# Patient Record
Sex: Male | Born: 1935 | Race: White | Hispanic: No | Marital: Married | Smoking: Former smoker
Health system: Southern US, Community
[De-identification: ages and names within clinical notes are randomized; demographics above are authoritative.]

## PROBLEM LIST (undated history)

## (undated) DIAGNOSIS — N400 Enlarged prostate without lower urinary tract symptoms: Secondary | ICD-10-CM

## (undated) DIAGNOSIS — E7800 Pure hypercholesterolemia, unspecified: Secondary | ICD-10-CM

## (undated) DIAGNOSIS — E119 Type 2 diabetes mellitus without complications: Secondary | ICD-10-CM

## (undated) DIAGNOSIS — I10 Essential (primary) hypertension: Secondary | ICD-10-CM

## (undated) DIAGNOSIS — G301 Alzheimer's disease with late onset: Secondary | ICD-10-CM

## (undated) DIAGNOSIS — F0280 Dementia in other diseases classified elsewhere without behavioral disturbance: Secondary | ICD-10-CM

## (undated) DIAGNOSIS — H3530 Unspecified macular degeneration: Secondary | ICD-10-CM

## (undated) HISTORY — DX: Pure hypercholesterolemia, unspecified: E78.00

## (undated) HISTORY — DX: Essential (primary) hypertension: I10

## (undated) HISTORY — PX: OTHER SURGICAL HISTORY: SHX169

## (undated) HISTORY — DX: Type 2 diabetes mellitus without complications: E11.9

## (undated) HISTORY — DX: Dementia in other diseases classified elsewhere, unspecified severity, without behavioral disturbance, psychotic disturbance, mood disturbance, and anxiety: F02.80

## (undated) HISTORY — PX: CATARACT EXTRACTION: SUR2

## (undated) HISTORY — DX: Alzheimer's disease with late onset: G30.1

## (undated) HISTORY — PX: MOUTH SURGERY: SHX715

## (undated) HISTORY — DX: Unspecified macular degeneration: H35.30

## (undated) HISTORY — DX: Benign prostatic hyperplasia without lower urinary tract symptoms: N40.0

---

## 1998-02-21 ENCOUNTER — Ambulatory Visit (HOSPITAL_COMMUNITY): Admission: RE | Admit: 1998-02-21 | Discharge: 1998-02-21 | Payer: Self-pay | Admitting: *Deleted

## 1999-04-18 ENCOUNTER — Encounter (INDEPENDENT_AMBULATORY_CARE_PROVIDER_SITE_OTHER): Payer: Self-pay | Admitting: *Deleted

## 1999-04-18 ENCOUNTER — Encounter (INDEPENDENT_AMBULATORY_CARE_PROVIDER_SITE_OTHER): Payer: Self-pay | Admitting: Specialist

## 1999-04-18 ENCOUNTER — Ambulatory Visit (HOSPITAL_COMMUNITY): Admission: RE | Admit: 1999-04-18 | Discharge: 1999-04-18 | Payer: Self-pay | Admitting: *Deleted

## 2001-09-21 HISTORY — PX: US ECHOCARDIOGRAPHY: HXRAD669

## 2002-01-03 ENCOUNTER — Ambulatory Visit (HOSPITAL_COMMUNITY): Admission: RE | Admit: 2002-01-03 | Discharge: 2002-01-03 | Payer: Self-pay | Admitting: Internal Medicine

## 2002-01-03 ENCOUNTER — Encounter: Payer: Self-pay | Admitting: Internal Medicine

## 2002-10-19 ENCOUNTER — Encounter (INDEPENDENT_AMBULATORY_CARE_PROVIDER_SITE_OTHER): Payer: Self-pay | Admitting: *Deleted

## 2002-10-19 ENCOUNTER — Encounter (INDEPENDENT_AMBULATORY_CARE_PROVIDER_SITE_OTHER): Payer: Self-pay | Admitting: Specialist

## 2002-10-19 ENCOUNTER — Ambulatory Visit (HOSPITAL_COMMUNITY): Admission: RE | Admit: 2002-10-19 | Discharge: 2002-10-19 | Payer: Self-pay | Admitting: *Deleted

## 2002-12-05 ENCOUNTER — Ambulatory Visit (HOSPITAL_COMMUNITY): Admission: RE | Admit: 2002-12-05 | Discharge: 2002-12-05 | Payer: Self-pay | Admitting: Internal Medicine

## 2002-12-05 ENCOUNTER — Encounter: Payer: Self-pay | Admitting: Internal Medicine

## 2004-02-26 ENCOUNTER — Encounter (INDEPENDENT_AMBULATORY_CARE_PROVIDER_SITE_OTHER): Payer: Self-pay | Admitting: *Deleted

## 2004-02-26 ENCOUNTER — Ambulatory Visit (HOSPITAL_COMMUNITY): Admission: RE | Admit: 2004-02-26 | Discharge: 2004-02-26 | Payer: Self-pay | Admitting: *Deleted

## 2004-02-26 ENCOUNTER — Encounter: Payer: Self-pay | Admitting: Gastroenterology

## 2004-03-13 ENCOUNTER — Ambulatory Visit (HOSPITAL_COMMUNITY): Admission: RE | Admit: 2004-03-13 | Discharge: 2004-03-13 | Payer: Self-pay | Admitting: Internal Medicine

## 2005-04-09 ENCOUNTER — Encounter (INDEPENDENT_AMBULATORY_CARE_PROVIDER_SITE_OTHER): Payer: Self-pay | Admitting: *Deleted

## 2005-04-09 ENCOUNTER — Ambulatory Visit (HOSPITAL_COMMUNITY): Admission: RE | Admit: 2005-04-09 | Discharge: 2005-04-09 | Payer: Self-pay | Admitting: *Deleted

## 2005-04-09 ENCOUNTER — Encounter (INDEPENDENT_AMBULATORY_CARE_PROVIDER_SITE_OTHER): Payer: Self-pay | Admitting: Specialist

## 2009-03-14 ENCOUNTER — Ambulatory Visit: Payer: Self-pay | Admitting: Gastroenterology

## 2009-03-14 DIAGNOSIS — Z860100 Personal history of colon polyps, unspecified: Secondary | ICD-10-CM | POA: Insufficient documentation

## 2009-03-14 DIAGNOSIS — K219 Gastro-esophageal reflux disease without esophagitis: Secondary | ICD-10-CM

## 2009-03-14 DIAGNOSIS — K5900 Constipation, unspecified: Secondary | ICD-10-CM | POA: Insufficient documentation

## 2009-03-14 DIAGNOSIS — K2270 Barrett's esophagus without dysplasia: Secondary | ICD-10-CM | POA: Insufficient documentation

## 2009-03-19 ENCOUNTER — Telehealth: Payer: Self-pay | Admitting: Gastroenterology

## 2009-03-22 ENCOUNTER — Ambulatory Visit: Payer: Self-pay | Admitting: Gastroenterology

## 2009-03-22 ENCOUNTER — Encounter: Payer: Self-pay | Admitting: Gastroenterology

## 2009-03-22 ENCOUNTER — Telehealth: Payer: Self-pay | Admitting: Gastroenterology

## 2009-03-26 ENCOUNTER — Encounter: Payer: Self-pay | Admitting: Gastroenterology

## 2009-03-26 ENCOUNTER — Telehealth: Payer: Self-pay | Admitting: Gastroenterology

## 2009-03-28 ENCOUNTER — Telehealth: Payer: Self-pay | Admitting: Gastroenterology

## 2009-04-09 ENCOUNTER — Telehealth: Payer: Self-pay | Admitting: Gastroenterology

## 2009-04-10 ENCOUNTER — Telehealth: Payer: Self-pay | Admitting: Gastroenterology

## 2009-04-15 ENCOUNTER — Telehealth: Payer: Self-pay | Admitting: Gastroenterology

## 2009-04-30 ENCOUNTER — Ambulatory Visit (HOSPITAL_COMMUNITY): Admission: RE | Admit: 2009-04-30 | Discharge: 2009-04-30 | Payer: Self-pay | Admitting: Internal Medicine

## 2009-04-30 ENCOUNTER — Ambulatory Visit: Payer: Self-pay | Admitting: Vascular Surgery

## 2009-06-04 ENCOUNTER — Emergency Department (HOSPITAL_COMMUNITY): Admission: EM | Admit: 2009-06-04 | Discharge: 2009-06-04 | Payer: Self-pay | Admitting: Emergency Medicine

## 2009-07-29 ENCOUNTER — Ambulatory Visit (HOSPITAL_COMMUNITY): Admission: RE | Admit: 2009-07-29 | Discharge: 2009-07-29 | Payer: Self-pay | Admitting: Internal Medicine

## 2009-09-17 HISTORY — PX: NM MYOCAR PERF WALL MOTION: HXRAD629

## 2010-10-15 LAB — URINALYSIS, ROUTINE W REFLEX MICROSCOPIC
Nitrite: NEGATIVE
Specific Gravity, Urine: 1.015 (ref 1.005–1.030)
pH: 7 (ref 5.0–8.0)

## 2010-10-15 LAB — GLUCOSE, CAPILLARY

## 2010-11-28 NOTE — Op Note (Signed)
   NAME:  Andres Bailey, Andres Bailey                          ACCOUNT NO.:  [id number]   MEDICAL RECORD NO.:  [id number]                   PATIENT TYPE:  AMB   LOCATION:  ENDO                                 FACILITY:  [facility]   PHYSICIAN:  Georgiana Spinner, M.D.                 DATE OF BIRTH:  May 13, 1930   DATE OF PROCEDURE:  DATE OF DISCHARGE:                                 OPERATIVE REPORT   PROCEDURE:  Upper endoscopy with biopsy.   INDICATIONS FOR PROCEDURE:  Gastroesophageal reflux disease, Barrett's  esophagus.   ANESTHESIA:  Demerol 50 mg, Versed 7.5 mg.   DESCRIPTION OF PROCEDURE:  With the patient mildly sedated in the left  lateral decubitus position, the Olympus videoscopic endoscope was inserted  into the mouth and passed under direct vision through the esophagus which  appeared normal until it reached the distal esophagus and there were clear  areas of islands of Barrett's tissue photographed and biopsied.  We entered  into the stomach.  Fundus, body, antrum, duodenal bulb, and second portion  of the duodenum all appeared normal.  From this point the endoscope was  slowly withdrawn taking ______ views of the duodenal mucosa until the  endoscope was then pulled back into the stomach, placed in retroflexion.  We  viewed the stomach from below.  The endoscope was straightened, withdrawn,  taking ________ views of the remaining gastric and esophageal mucosa.  The  patient's vital signs and pulse oximetry remained stable.  The patient  tolerated the procedure well without apparent complications.   FINDINGS:  Barrett's esophagus biopsied.  Await biopsy report.  Patient will  call in for results and follow up with me as an outpatient.                                               Georgiana Spinner, M.D.    GMO/MEDQ  D:  10/19/2002  T:  10/20/2002  Job:  [id number]

## 2010-11-28 NOTE — Op Note (Signed)
NAME:  Andres Bailey, Andres Bailey                          ACCOUNT NO.:  [id number]   MEDICAL RECORD NO.:  [id number]                   PATIENT TYPE:  AMB   LOCATION:  ENDO                                 FACILITY:  [facility]   PHYSICIAN:  Georgiana Spinner, M.D.                 DATE OF BIRTH:  [birth year]   DATE OF PROCEDURE:  02/26/2004  DATE OF DISCHARGE:                                 OPERATIVE REPORT   PROCEDURE PERFORMED:  Upper endoscopy.   ENDOSCOPIST:  Georgiana Spinner, M.D.   ANESTHESIA:  Demerol 75 mg, Versed 7.5 mg.   DESCRIPTION OF PROCEDURE:  With the patient mildly sedated in the left  lateral decubitus position, the Olympus video endoscope was inserted in the  mouth and passed under direct vision through the esophagus which appeared  normal until we reached the distal esophagus.  There was an area of  questionable Barrett's photographed and biopsied.  We entered into the  stomach.  The fundus, body appeared normal.  The antrum was quite  erythematous in a diffuse manner.  This was photographed and multiple  biopsies taken.  The duodenal bulb and second portion of the duodenum were  unremarkable.  From this point, the endoscope was slowly withdrawn taking  circumferential views of the duodenal mucosa until the endoscope was pulled  back into the stomach and placed on retroflexion to view the stomach from  below.  The endoscope was then straightened and withdrawn taking  circumferential views of the remaining gastric and esophageal mucosa.  The  patient's vital signs and pulse oximeter remained stable.  The patient  tolerated the procedure well without apparent complications.   FINDINGS:  Antral erythema with gastritis, biopsied.  Question of Barrett's  esophagus, biopsied.  Await biopsy report.  Patient will call me for results  and follow up with me as an outpatient.  Proceed to colonoscopy as planned.                                               Georgiana Spinner, M.D.    GMO/MEDQ  D:   02/26/2004  T:  02/26/2004  Job:  [id number]

## 2010-11-28 NOTE — Op Note (Signed)
NAME:  Andres Bailey, Andres Bailey                          ACCOUNT NO.:  [id number]   MEDICAL RECORD NO.:  [id number]                   PATIENT TYPE:  AMB   LOCATION:  ENDO                                 FACILITY:  [facility]   PHYSICIAN:  Georgiana Spinner, M.D.                 DATE OF BIRTH:  04/21/1930   DATE OF PROCEDURE:  DATE OF DISCHARGE:                                 OPERATIVE REPORT   Audio too short to transcribe (less than 5 seconds)                                               Georgiana Spinner, M.D.    GMO/MEDQ  D:  02/26/2004  T:  02/26/2004  Job:  [id number]

## 2010-11-28 NOTE — Assessment & Plan Note (Signed)
[facility]                                 ON-CALL NOTE   DEWEY, VIENS                         MRN:          [id number]  DATE:03/23/2009                            DOB:          April 06, 1930    Patient of Dr. Russella Dar   Mr. Gracey called tonight to say that he is having some burning  discomfort in the umbilical area.  He underwent an upper endoscopy and  colonoscopy yesterday.  He is unaware of the findings.  He developed  some burning throughout the day which was a little bit worse tonight.  He is without abdominal pain, per se.   I instructed him to take Tums.  If symptoms continue, he was told to  call back in the morning.     Barbette Hair. Arlyce Dice, MD,FACG  Electronically Signed    RDK/MedQ  DD: 03/23/2009  DT: 03/24/2009  Job #: [id number]

## 2010-11-28 NOTE — Op Note (Signed)
NAME:  Andres Bailey, Andres Bailey                          ACCOUNT NO.:  [id number]   MEDICAL RECORD NO.:  [id number]                   PATIENT TYPE:  AMB   LOCATION:  ENDO                                 FACILITY:  [facility]   PHYSICIAN:  Georgiana Spinner, M.D.                 DATE OF BIRTH:  04/06/1930   DATE OF PROCEDURE:  02/26/2004  DATE OF DISCHARGE:                                 OPERATIVE REPORT   PROCEDURE PERFORMED:  Colonoscopy.   ENDOSCOPIST:  Georgiana Spinner, M.D.   INDICATIONS FOR PROCEDURE:  Colon polyp.   ANESTHESIA:  None further given.   DESCRIPTION OF PROCEDURE:  With the patient mildly sedated in the left  lateral decubitus position, the Olympus video colonoscope was inserted in  the rectum and passed under direct vision to the cecum, identified by the  ileocecal valve and appendiceal orifice, both of which were photographed.  From this point the colonoscope was slowly withdrawn taking circumferential  views ___________. The endoscope was straightened and withdrawn.  The  patient's vital signs and pulse oximeter remained stable.  The patient  tolerated the procedure well without apparent complications.   FINDINGS:  Diverticulosis of the sigmoid colon.  Internal hemorrhoids.  ____________                                               Georgiana Spinner, M.D.    GMO/MEDQ  D:  02/26/2004  T:  02/26/2004  Job:  [id number]

## 2010-11-28 NOTE — Op Note (Signed)
NAMEKHALON, CANSLER NO.:  [id number]   MEDICAL RECORD NO.:  [id number]          PATIENT TYPE:  AMB   LOCATION:  ENDO                         FACILITY:  [facility]   PHYSICIAN:  Georgiana Spinner, M.D.    DATE OF BIRTH:  [birth year]   DATE OF PROCEDURE:  04/09/2005  DATE OF DISCHARGE:                                 OPERATIVE REPORT   PROCEDURE:  Endoscopy.   ENDOSCOPIST:  Georgiana Spinner, M.D.   INDICATIONS:  Gastroesophageal reflux disease with Barrett's esophagus.   ANESTHESIA:  Demerol 50, Versed 5 milligrams.   PROCEDURE IN DETAIL:  The patient was sedated and placed in the dorsal  lithotomy position. The endoscope was inserted in the mouth and passed under  direct vision through the esophagus which appeared normal until we reached  the distal esophagus.  Photographs and biopsies were taken.  We entered into  the stomach.  Fundus, body, antrum, duodenal bulb, second portion of the  duodenum were visualized. From this point, the endoscope was slowly  withdrawn taking circumferential views of duodenal mucosa until the  endoscope was pulled back into the stomach and placed in retroflexion. The  endoscope was then straightened and withdrawn obtaining circumferential  views of the remaining gastric and esophageal mucosa. The patient's vital  signs and pulse oximeter remained stable. The patient tolerated the  procedure well without apparent complications.   FINDINGS:  Question of Barrett's esophagus biopsied with biopsy report. The  patient will follow the results. Follow-up with me as an outpatient.           ______________________________  Georgiana Spinner, M.D.     GMO/MEDQ  D:  04/09/2005  T:  04/09/2005  Job:  [id number]

## 2011-02-03 ENCOUNTER — Encounter: Payer: Self-pay | Admitting: Podiatry

## 2011-07-20 ENCOUNTER — Ambulatory Visit (HOSPITAL_COMMUNITY)
Admission: RE | Admit: 2011-07-20 | Discharge: 2011-07-20 | Disposition: A | Discharge status: Home / Self Care | Payer: Medicare Other | Source: Ambulatory Visit | Attending: Internal Medicine | Admitting: Internal Medicine

## 2011-07-20 ENCOUNTER — Other Ambulatory Visit (HOSPITAL_COMMUNITY): Payer: Self-pay | Admitting: Internal Medicine

## 2011-07-20 DIAGNOSIS — R05 Cough: Secondary | ICD-10-CM

## 2011-07-20 DIAGNOSIS — R059 Cough, unspecified: Secondary | ICD-10-CM | POA: Insufficient documentation

## 2011-07-20 DIAGNOSIS — Z0000 Encounter for general adult medical examination without abnormal findings: Secondary | ICD-10-CM | POA: Insufficient documentation

## 2011-08-04 ENCOUNTER — Institutional Professional Consult (permissible substitution): Payer: Medicare Other | Admitting: Pulmonary Disease

## 2011-08-11 ENCOUNTER — Encounter: Payer: Self-pay | Admitting: Pulmonary Disease

## 2011-08-11 ENCOUNTER — Ambulatory Visit (INDEPENDENT_AMBULATORY_CARE_PROVIDER_SITE_OTHER): Payer: Medicare Other | Admitting: Pulmonary Disease

## 2011-08-11 VITALS — BP 120/72 | HR 74 | Temp 98.1°F | Ht 67.0 in | Wt 162.0 lb

## 2011-08-11 DIAGNOSIS — R091 Pleurisy: Secondary | ICD-10-CM

## 2011-08-11 DIAGNOSIS — J949 Pleural condition, unspecified: Secondary | ICD-10-CM

## 2011-08-11 NOTE — Patient Instructions (Signed)
Will set up for scan of your chest.  Will call you with results.

## 2011-08-11 NOTE — Assessment & Plan Note (Signed)
The patient has been found to have what appears to be pleural plaques on his chest x-ray, and does have a history that suggests significant asbestos exposure.  It is unclear from the x-ray or exam whether he may have actual asbestosis.  He certainly doesn't seem to have a lot of issues with dyspnea.  I had a long conversation with him about asbestos exposure and asbestosis, as well as the increased risk of pulmonary malignancies associated with the exposure and history of smoking.  At this point, I would recommend a CT of the chest to rule out asbestosis, and also to screen for potential occult malignancies.  If this is unremarkable, I would simply do a yearly chest x-ray as part of his history and physical.
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
## 2011-08-14 ENCOUNTER — Ambulatory Visit (INDEPENDENT_AMBULATORY_CARE_PROVIDER_SITE_OTHER)
Admission: RE | Admit: 2011-08-14 | Discharge: 2011-08-14 | Disposition: A | Discharge status: Home / Self Care | Payer: Medicare Other | Source: Ambulatory Visit | Attending: Pulmonary Disease | Admitting: Pulmonary Disease

## 2011-08-14 DIAGNOSIS — R091 Pleurisy: Secondary | ICD-10-CM

## 2011-08-14 DIAGNOSIS — J949 Pleural condition, unspecified: Secondary | ICD-10-CM

## 2011-09-15 ENCOUNTER — Telehealth: Payer: Self-pay | Admitting: Pulmonary Disease

## 2011-09-15 NOTE — Telephone Encounter (Signed)
I spoke with the pt and his wife again and gave the results of CT from 08-14-11. Carron Curie, CMA

## 2011-11-30 ENCOUNTER — Ambulatory Visit
Admission: RE | Admit: 2011-11-30 | Discharge: 2011-11-30 | Disposition: A | Discharge status: Home / Self Care | Payer: Medicare Other | Source: Ambulatory Visit | Attending: Internal Medicine | Admitting: Internal Medicine

## 2011-11-30 ENCOUNTER — Other Ambulatory Visit: Payer: Self-pay | Admitting: Internal Medicine

## 2011-11-30 DIAGNOSIS — R52 Pain, unspecified: Secondary | ICD-10-CM

## 2012-02-09 ENCOUNTER — Encounter: Payer: Self-pay | Admitting: Gastroenterology

## 2012-05-05 ENCOUNTER — Other Ambulatory Visit: Payer: Self-pay | Admitting: Dermatology

## 2012-07-25 ENCOUNTER — Other Ambulatory Visit: Payer: Self-pay | Admitting: Internal Medicine

## 2012-07-25 DIAGNOSIS — R911 Solitary pulmonary nodule: Secondary | ICD-10-CM

## 2012-08-01 ENCOUNTER — Ambulatory Visit
Admission: RE | Admit: 2012-08-01 | Discharge: 2012-08-01 | Disposition: A | Discharge status: Home / Self Care | Payer: Medicare Other | Source: Ambulatory Visit | Attending: Internal Medicine | Admitting: Internal Medicine

## 2012-08-01 DIAGNOSIS — R911 Solitary pulmonary nodule: Secondary | ICD-10-CM

## 2012-10-27 ENCOUNTER — Encounter: Payer: Self-pay | Admitting: Gastroenterology

## 2013-04-06 ENCOUNTER — Encounter: Payer: Self-pay | Admitting: *Deleted

## 2013-04-07 ENCOUNTER — Ambulatory Visit (INDEPENDENT_AMBULATORY_CARE_PROVIDER_SITE_OTHER): Payer: Medicare Other | Admitting: Cardiovascular Disease

## 2013-04-07 ENCOUNTER — Encounter: Payer: Self-pay | Admitting: Cardiovascular Disease

## 2013-04-07 VITALS — BP 122/72 | HR 77 | Resp 20 | Ht 67.5 in | Wt 145.8 lb

## 2013-04-07 DIAGNOSIS — I10 Essential (primary) hypertension: Secondary | ICD-10-CM

## 2013-04-07 DIAGNOSIS — E119 Type 2 diabetes mellitus without complications: Secondary | ICD-10-CM

## 2013-04-07 DIAGNOSIS — E785 Hyperlipidemia, unspecified: Secondary | ICD-10-CM

## 2013-04-07 NOTE — Patient Instructions (Addendum)
Make sure you are not taking both Rapaflo and Flomax.  Your physician recommends that you schedule a follow-up appointment in: 12 months.

## 2013-04-11 DIAGNOSIS — E119 Type 2 diabetes mellitus without complications: Secondary | ICD-10-CM | POA: Insufficient documentation

## 2013-04-11 DIAGNOSIS — E785 Hyperlipidemia, unspecified: Secondary | ICD-10-CM | POA: Insufficient documentation

## 2013-04-11 DIAGNOSIS — I10 Essential (primary) hypertension: Secondary | ICD-10-CM | POA: Insufficient documentation
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
## 2013-04-18 ENCOUNTER — Ambulatory Visit: Payer: Self-pay | Admitting: Podiatry

## 2013-05-23 ENCOUNTER — Ambulatory Visit: Payer: Self-pay | Admitting: Podiatry

## 2013-05-29 ENCOUNTER — Telehealth: Payer: Self-pay | Admitting: *Deleted

## 2013-05-29 MED ORDER — TAMSULOSIN HCL 0.4 MG PO CAPS: 0.4000 mg | ORAL_CAPSULE | Freq: Every day | ORAL | Status: DC

## 2013-05-29 NOTE — Telephone Encounter (Signed)
Pt need a 30 day rx send to local pharm until his mail order rx comes in.  tamsulosin .4mg  to [facility]=  [phone] per pt

## 2013-07-11 ENCOUNTER — Encounter: Payer: Self-pay | Admitting: Cardiovascular Disease

## 2013-07-14 ENCOUNTER — Other Ambulatory Visit: Payer: Self-pay | Admitting: Internal Medicine

## 2013-07-14 MED ORDER — FINASTERIDE 5 MG PO TABS: 5.0000 mg | ORAL_TABLET | Freq: Every day | ORAL | Status: DC

## 2013-07-14 MED ORDER — ROSUVASTATIN CALCIUM 5 MG PO TABS: 5.0000 mg | ORAL_TABLET | Freq: Every day | ORAL | Status: DC

## 2013-07-16 ENCOUNTER — Encounter: Payer: Self-pay | Admitting: Internal Medicine

## 2013-07-19 ENCOUNTER — Encounter: Payer: Self-pay | Admitting: Emergency Medicine

## 2013-08-02 ENCOUNTER — Other Ambulatory Visit: Payer: Self-pay | Admitting: Emergency Medicine

## 2013-08-02 MED ORDER — TAMSULOSIN HCL 0.4 MG PO CAPS: 0.4000 mg | ORAL_CAPSULE | Freq: Every day | ORAL | Status: DC

## 2013-08-04 ENCOUNTER — Other Ambulatory Visit: Payer: Self-pay | Admitting: Physician Assistant

## 2013-08-14 ENCOUNTER — Other Ambulatory Visit: Payer: Self-pay | Admitting: Internal Medicine

## 2013-08-31 ENCOUNTER — Other Ambulatory Visit: Payer: Self-pay | Admitting: *Deleted

## 2013-08-31 MED ORDER — ROSUVASTATIN CALCIUM 5 MG PO TABS
5.0000 mg | ORAL_TABLET | Freq: Every day | ORAL | Status: DC
Start: 2013-08-31 — End: 2013-11-25

## 2013-09-09 ENCOUNTER — Other Ambulatory Visit: Payer: Self-pay | Admitting: Internal Medicine

## 2013-10-17 ENCOUNTER — Encounter: Payer: Self-pay | Admitting: Internal Medicine

## 2013-10-17 NOTE — Progress Notes (Signed)
Patient ID: Andres Bailey, male   DOB: 01/13/1930, 80 y.o.   MRN: [id number]  xxxxxxxxxxxxxxxxxxxx

## 2013-11-25 ENCOUNTER — Other Ambulatory Visit: Payer: Self-pay | Admitting: Internal Medicine

## 2013-11-27 ENCOUNTER — Other Ambulatory Visit: Payer: Self-pay | Admitting: Internal Medicine

## 2013-12-10 ENCOUNTER — Other Ambulatory Visit: Payer: Self-pay | Admitting: Emergency Medicine

## 2014-01-16 ENCOUNTER — Ambulatory Visit: Payer: Self-pay | Admitting: Emergency Medicine

## 2014-01-26 ENCOUNTER — Encounter: Payer: Self-pay | Admitting: Gastroenterology

## 2014-02-01 ENCOUNTER — Other Ambulatory Visit: Payer: Self-pay | Admitting: Emergency Medicine

## 2014-03-01 ENCOUNTER — Other Ambulatory Visit: Payer: Self-pay | Admitting: Physician Assistant

## 2014-03-15 ENCOUNTER — Other Ambulatory Visit: Payer: Self-pay | Admitting: Internal Medicine

## 2014-03-21 ENCOUNTER — Other Ambulatory Visit: Payer: Self-pay | Admitting: Emergency Medicine

## 2014-04-12 ENCOUNTER — Other Ambulatory Visit: Payer: Self-pay | Admitting: Internal Medicine

## 2014-04-28 ENCOUNTER — Other Ambulatory Visit: Payer: Self-pay | Admitting: Internal Medicine

## 2014-07-23 ENCOUNTER — Encounter: Payer: Self-pay | Admitting: Emergency Medicine

## 2014-09-03 IMAGING — CT CT CHEST W/O CM
2 of 4 series · 15 of 36 positions shown, 18 images · non-contrast
Comparison: The CT chest of 08/14/2011

CLINICAL DATA: Follow up of lung nodule, former smoking history

CT CHEST WITHOUT CONTRAST
TECHNIQUE: Multidetector CT imaging of the chest was performed
following the standard protocol without IV contrast.

[Series 2: chest w/o · axial · non-contrast · 0.72mm/px · z∈[-236,+9]mm · 12 of 59 slices shown, 15 images]
[im 5/59  mediastinal]
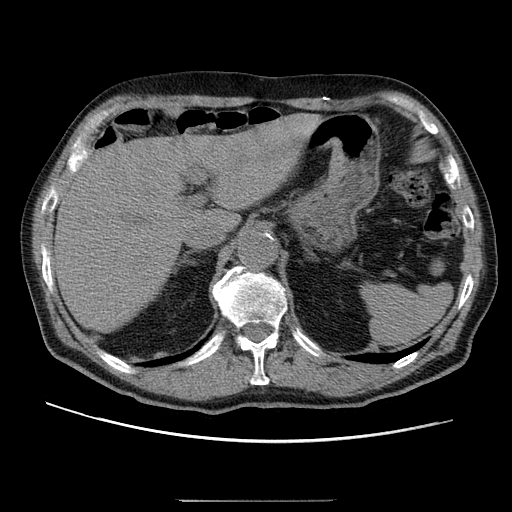
[im 5/59  lung]
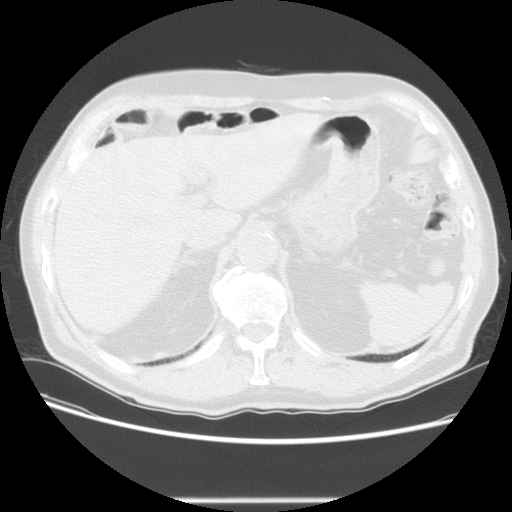
[im 9/59  lung]
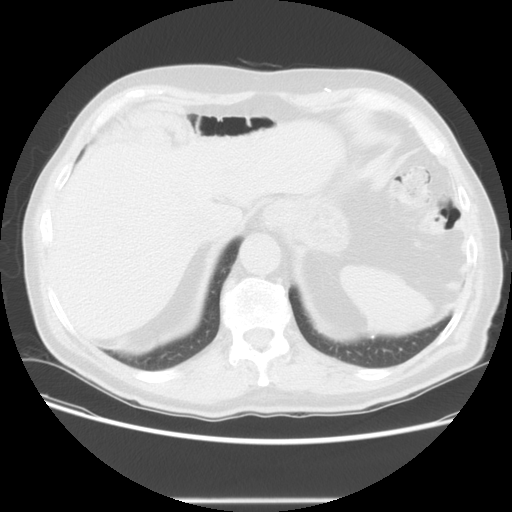
[im 14/59  lung]
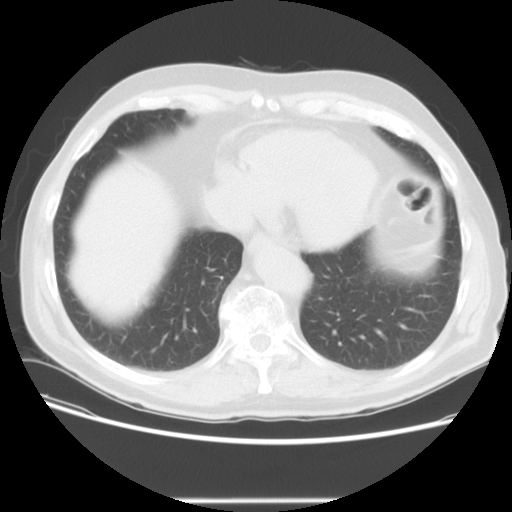
[im 18/59  lung]
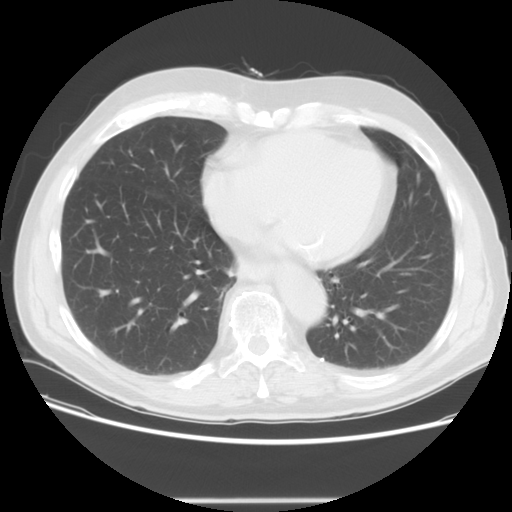
[im 23/59  mediastinal]
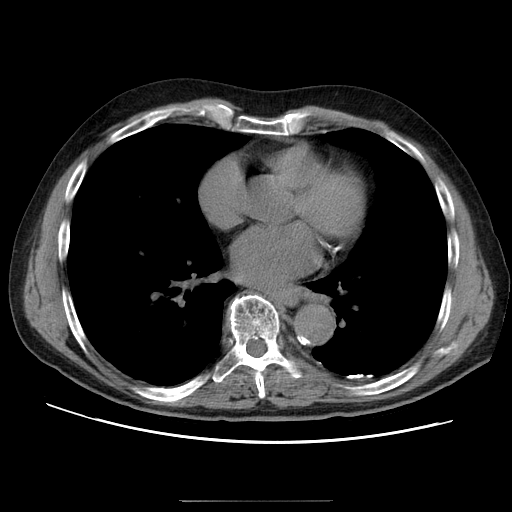
[im 23/59  lung]
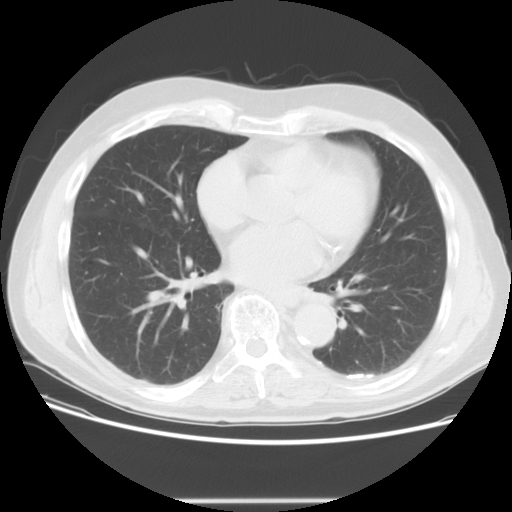
[im 27/59  lung]
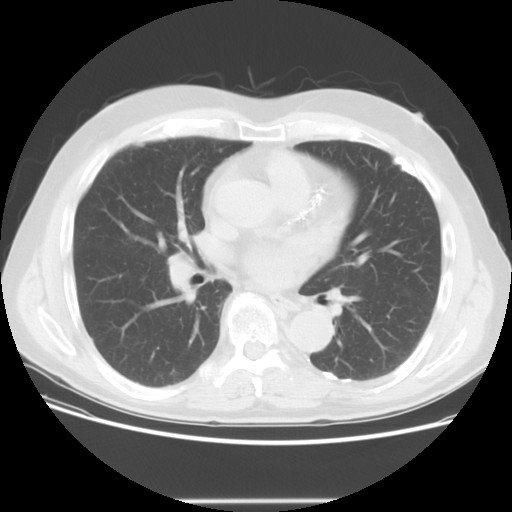
[im 32/59  lung]
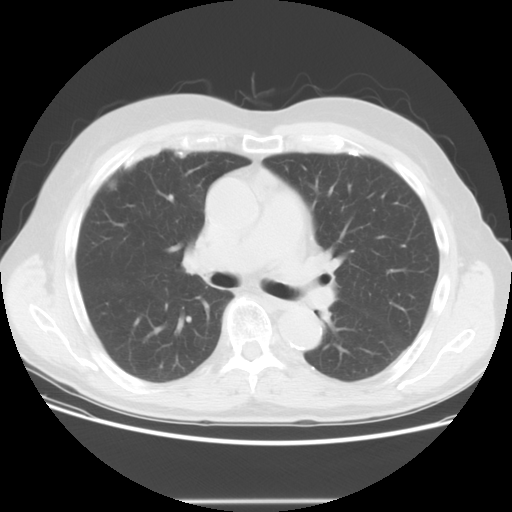
[im 36/59  lung]
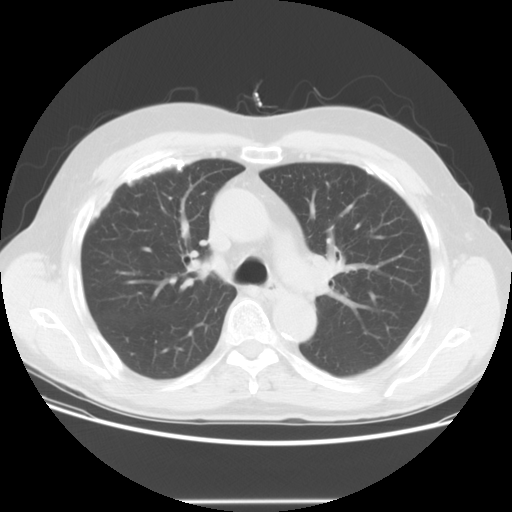
[im 41/59  mediastinal]
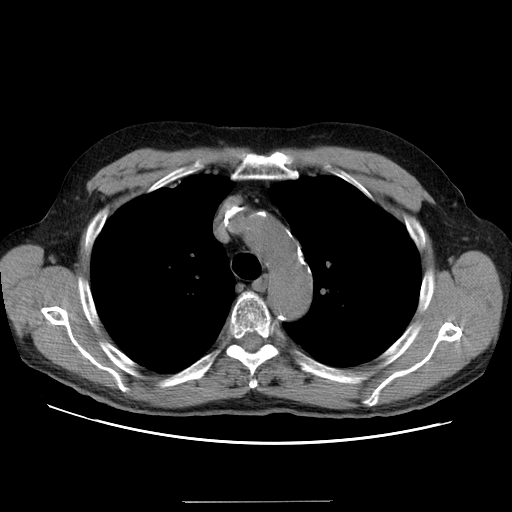
[im 41/59  lung]
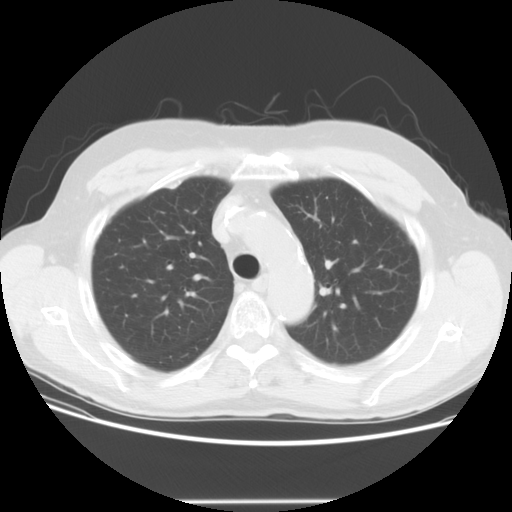
[im 45/59  lung]
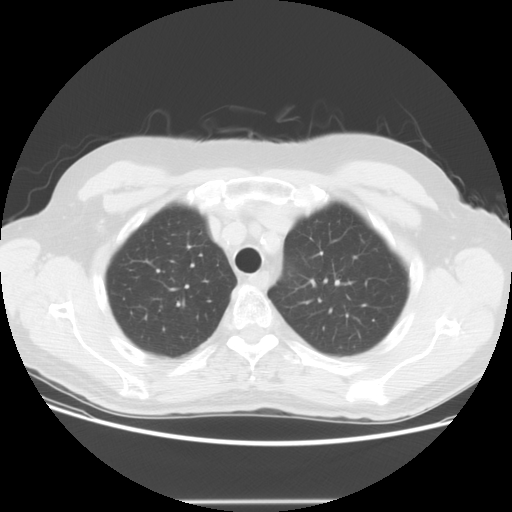
[im 50/59  lung]
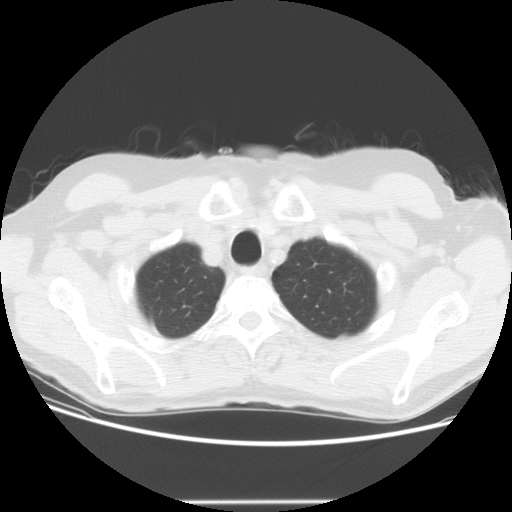
[im 54/59  lung]
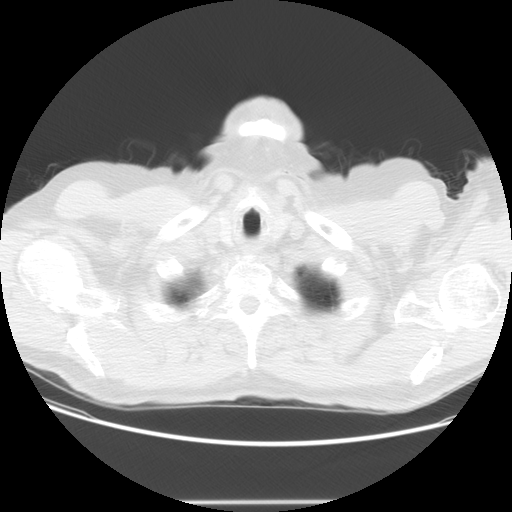

[Series 400: cor · coronal · 0.72mm/px · 3 of 104 slices shown]
[im 21/104  lung]
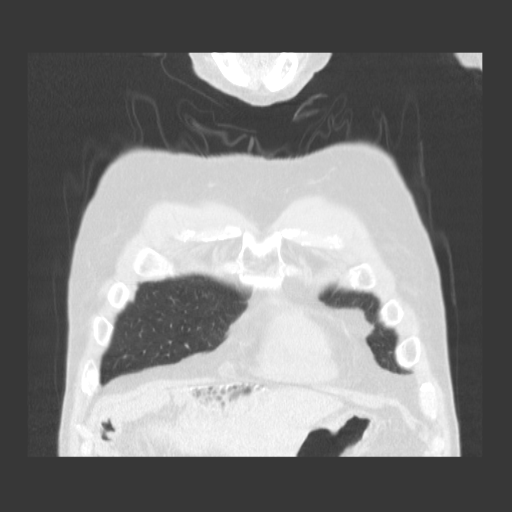
[im 42/104  lung]
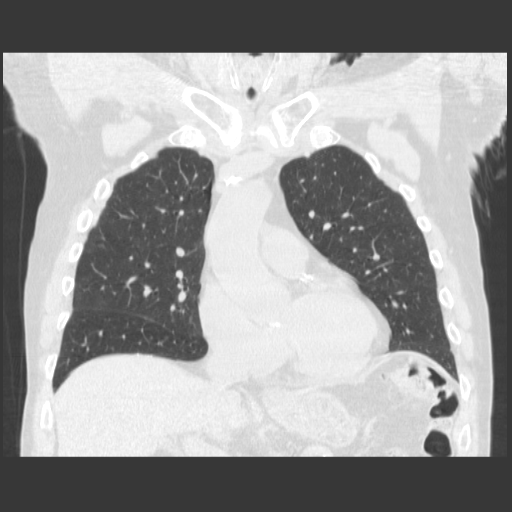
[im 62/104  lung]
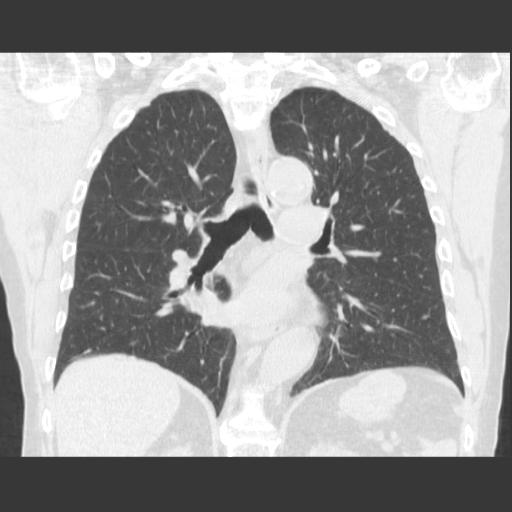

[15 of 36 positions shown; findings below may reference images not displayed]

FINDINGS: The 3.6 mm noncalcified nodule in the left lower lobe
subpleural in location is completely stable.  The previously
described small "cluster of nodules" within the peripheral right
lower lobe is no longer seen.  No new lung nodule is seen.  Stable
calcified pleural plaques are noted bilaterally which are asbestos
related.  No infiltrate or effusion is noted.  The central airway
is patent.

On soft tissue window images, the thyroid gland is unremarkable.
On this unenhanced study, no mediastinal or hilar adenopathy is
seen.  Coronary artery calcifications are noted which are
consistent with age.  Faint calcification of the right
hemidiaphragm is noted and is stable.  No abnormality of the upper
abdomen is seen.  There are diffuse degenerative changes throughout
the thoracic spine.
IMPRESSION: 1.  Stable asbestos related calcified pleural plaques bilaterally.
2.  Stable 3.6 mm noncalcified nodule in the left lower lobe.  No
new or enlarging lung nodule is seen.
3.  Resolution of small "cluster of nodules" previously noted
within the right lower lobe peripherally.

## 2015-08-14 DEATH — deceased
